# Patient Record
Sex: Male | Born: 1990 | Race: White | Hispanic: No | Marital: Married | Smoking: Never smoker
Health system: Southern US, Community
[De-identification: ages and names within clinical notes are randomized; demographics above are authoritative.]

## PROBLEM LIST (undated history)

## (undated) DIAGNOSIS — R002 Palpitations: Secondary | ICD-10-CM

## (undated) HISTORY — PX: NO PAST SURGERIES: SHX2092

## (undated) HISTORY — DX: Palpitations: R00.2

---
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
## 2014-07-14 ENCOUNTER — Emergency Department: Payer: Self-pay | Admitting: Emergency Medicine
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
## 2020-02-17 ENCOUNTER — Ambulatory Visit: Payer: Self-pay | Attending: Internal Medicine

## 2020-02-17 DIAGNOSIS — Z23 Encounter for immunization: Secondary | ICD-10-CM

## 2020-02-17 NOTE — Progress Notes (Signed)
   Covid-19 Vaccination Clinic  Name:  Greg Chen    MRN: [id number] DOB: 28-May-1991  02/17/2020  Mr. Greg Chen was observed post Covid-19 immunization for 15 minutes without incident. He was provided with Vaccine Information Sheet and instruction to access the V-Safe system.   Mr. Greg Chen was instructed to call 911 with any severe reactions post vaccine: Marland Kitchen Difficulty breathing  . Swelling of face and throat  . A fast heartbeat  . A bad rash all over body  . Dizziness and weakness   Immunizations Administered    Name Date Dose VIS Date Route   Pfizer COVID-19 Vaccine 02/17/2020  4:40 PM 0.3 mL 11/03/2019 Intramuscular   Manufacturer: [employer], [employer]   Lot: [id number]   NDC: [address]-2

## 2020-03-12 ENCOUNTER — Ambulatory Visit: Payer: Self-pay | Attending: Internal Medicine

## 2020-03-12 DIAGNOSIS — Z23 Encounter for immunization: Secondary | ICD-10-CM

## 2020-03-12 NOTE — Progress Notes (Signed)
   Covid-19 Vaccination Clinic  Name:  Greg Chen    MRN: [id number] DOB: 1991/11/13  03/12/2020  [name] was observed post Covid-19 immunization for 15 minutes without incident. He was provided with Vaccine Information Sheet and instruction to access the V-Safe system.   [name] was instructed to call 911 with any severe reactions post vaccine: Marland Kitchen Difficulty breathing  . Swelling of face and throat  . A fast heartbeat  . A bad rash all over body  . Dizziness and weakness   Immunizations Administered    Name Date Dose VIS Date Route   Pfizer COVID-19 Vaccine 03/12/2020  3:52 PM 0.3 mL 01/17/2019 Intramuscular   Manufacturer: [employer], [employer]   Lot: [id number]   NDC: [address]-3

## 2022-11-18 ENCOUNTER — Other Ambulatory Visit (HOSPITAL_COMMUNITY): Payer: Self-pay

## 2022-11-18 MED ORDER — LISDEXAMFETAMINE DIMESYLATE 30 MG PO CAPS
30.0000 mg | ORAL_CAPSULE | Freq: Every morning | ORAL | 0 refills | Status: AC
  Filled 2022-11-18: qty 30, 30d supply, fill #0

## 2022-11-20 ENCOUNTER — Other Ambulatory Visit (HOSPITAL_COMMUNITY): Payer: Self-pay

## 2022-12-29 ENCOUNTER — Other Ambulatory Visit (HOSPITAL_COMMUNITY): Payer: Self-pay

## 2023-01-14 ENCOUNTER — Other Ambulatory Visit (HOSPITAL_COMMUNITY): Payer: Self-pay

## 2023-01-14 MED ORDER — LISDEXAMFETAMINE DIMESYLATE 30 MG PO CAPS
30.0000 mg | ORAL_CAPSULE | Freq: Every morning | ORAL | 0 refills | Status: AC
  Filled 2023-02-16: qty 30, 30d supply, fill #0

## 2023-01-14 MED ORDER — LISDEXAMFETAMINE DIMESYLATE 30 MG PO CAPS
30.0000 mg | ORAL_CAPSULE | Freq: Every morning | ORAL | 0 refills | Status: DC
  Filled 2023-03-27: qty 30, 30d supply, fill #0

## 2023-01-14 MED ORDER — LISDEXAMFETAMINE DIMESYLATE 30 MG PO CAPS
30.0000 mg | ORAL_CAPSULE | Freq: Every morning | ORAL | 0 refills | Status: AC
  Filled 2023-01-14: qty 30, 30d supply, fill #0

## 2023-02-17 ENCOUNTER — Other Ambulatory Visit (HOSPITAL_COMMUNITY): Payer: Self-pay

## 2023-02-24 ENCOUNTER — Other Ambulatory Visit (HOSPITAL_COMMUNITY): Payer: Self-pay

## 2023-03-29 ENCOUNTER — Other Ambulatory Visit (HOSPITAL_COMMUNITY): Payer: Self-pay

## 2023-04-21 ENCOUNTER — Other Ambulatory Visit (HOSPITAL_COMMUNITY): Payer: Self-pay

## 2023-04-21 MED ORDER — LISDEXAMFETAMINE DIMESYLATE 30 MG PO CAPS
30.0000 mg | ORAL_CAPSULE | Freq: Every morning | ORAL | 0 refills | Status: AC
  Filled 2023-04-29: qty 30, 30d supply, fill #0
  Filled 2023-04-30: qty 10, 10d supply, fill #0
  Filled 2023-04-30: qty 20, 20d supply, fill #0
  Filled 2023-04-30: qty 20, 20d supply, fill #1
  Filled 2023-04-30: qty 20, 20d supply, fill #0
  Filled 2023-04-30: qty 10, 10d supply, fill #0

## 2023-04-21 MED ORDER — LISDEXAMFETAMINE DIMESYLATE 30 MG PO CAPS
30.0000 mg | ORAL_CAPSULE | Freq: Every morning | ORAL | 0 refills | Status: AC
  Filled 2023-08-10: qty 30, 30d supply, fill #0

## 2023-04-21 MED ORDER — LISDEXAMFETAMINE DIMESYLATE 30 MG PO CAPS
30.0000 mg | ORAL_CAPSULE | Freq: Every morning | ORAL | 0 refills | Status: AC
  Filled 2023-06-04: qty 30, 30d supply, fill #0

## 2023-04-29 ENCOUNTER — Other Ambulatory Visit: Payer: Self-pay

## 2023-04-29 ENCOUNTER — Other Ambulatory Visit (HOSPITAL_COMMUNITY): Payer: Self-pay

## 2023-04-30 ENCOUNTER — Other Ambulatory Visit: Payer: Self-pay

## 2023-04-30 ENCOUNTER — Other Ambulatory Visit (HOSPITAL_COMMUNITY): Payer: Self-pay

## 2023-05-01 ENCOUNTER — Other Ambulatory Visit (HOSPITAL_COMMUNITY): Payer: Self-pay

## 2023-05-05 ENCOUNTER — Other Ambulatory Visit (HOSPITAL_COMMUNITY): Payer: Self-pay

## 2023-06-04 ENCOUNTER — Other Ambulatory Visit: Payer: Self-pay

## 2023-06-04 ENCOUNTER — Other Ambulatory Visit (HOSPITAL_COMMUNITY): Payer: Self-pay

## 2023-06-07 ENCOUNTER — Other Ambulatory Visit (HOSPITAL_COMMUNITY): Payer: Self-pay

## 2023-07-05 ENCOUNTER — Other Ambulatory Visit: Payer: Self-pay

## 2023-07-05 ENCOUNTER — Other Ambulatory Visit (HOSPITAL_COMMUNITY): Payer: Self-pay

## 2023-07-05 MED ORDER — LISDEXAMFETAMINE DIMESYLATE 30 MG PO CAPS: 30.0000 mg | ORAL_CAPSULE | Freq: Every morning | ORAL | 0 refills | Status: AC

## 2023-07-05 MED ORDER — LISDEXAMFETAMINE DIMESYLATE 30 MG PO CAPS
30.0000 mg | ORAL_CAPSULE | Freq: Every morning | ORAL | 0 refills | Status: AC
  Filled 2023-07-05: qty 30, 30d supply, fill #0

## 2023-07-05 MED ORDER — LISDEXAMFETAMINE DIMESYLATE 30 MG PO CAPS
30.0000 mg | ORAL_CAPSULE | Freq: Every morning | ORAL | 0 refills | Status: AC
  Filled 2023-09-16: qty 30, 30d supply, fill #0

## 2023-07-05 MED ORDER — LISDEXAMFETAMINE DIMESYLATE 30 MG PO CAPS
30.0000 mg | ORAL_CAPSULE | Freq: Every morning | ORAL | 0 refills | Status: AC
  Filled 2023-11-18: qty 30, 30d supply, fill #0

## 2023-08-10 ENCOUNTER — Other Ambulatory Visit (HOSPITAL_COMMUNITY): Payer: Self-pay

## 2023-09-16 ENCOUNTER — Other Ambulatory Visit: Payer: Self-pay

## 2023-09-16 ENCOUNTER — Other Ambulatory Visit (HOSPITAL_COMMUNITY): Payer: Self-pay

## 2023-09-27 ENCOUNTER — Other Ambulatory Visit (HOSPITAL_COMMUNITY): Payer: Self-pay

## 2023-09-27 MED ORDER — LISDEXAMFETAMINE DIMESYLATE 30 MG PO CAPS
30.0000 mg | ORAL_CAPSULE | Freq: Every morning | ORAL | 0 refills | Status: AC
  Filled 2023-10-17: qty 30, 30d supply, fill #0

## 2023-09-27 MED ORDER — LISDEXAMFETAMINE DIMESYLATE 30 MG PO CAPS: 30.0000 mg | ORAL_CAPSULE | Freq: Every morning | ORAL | 0 refills | Status: AC

## 2023-10-18 ENCOUNTER — Other Ambulatory Visit (HOSPITAL_COMMUNITY): Payer: Self-pay

## 2023-10-19 ENCOUNTER — Other Ambulatory Visit (HOSPITAL_COMMUNITY): Payer: Self-pay

## 2023-11-18 ENCOUNTER — Other Ambulatory Visit (HOSPITAL_COMMUNITY): Payer: Self-pay

## 2023-11-19 ENCOUNTER — Other Ambulatory Visit (HOSPITAL_COMMUNITY): Payer: Self-pay

## 2023-12-20 ENCOUNTER — Other Ambulatory Visit (HOSPITAL_COMMUNITY): Payer: Self-pay

## 2023-12-20 MED ORDER — LISDEXAMFETAMINE DIMESYLATE 20 MG PO CAPS
20.0000 mg | ORAL_CAPSULE | Freq: Every day | ORAL | 0 refills | Status: DC
  Filled 2023-12-20: qty 30, 30d supply, fill #0

## 2024-01-17 ENCOUNTER — Other Ambulatory Visit (HOSPITAL_COMMUNITY): Payer: Self-pay

## 2024-01-17 MED ORDER — LISDEXAMFETAMINE DIMESYLATE 20 MG PO CAPS
20.0000 mg | ORAL_CAPSULE | Freq: Every day | ORAL | 0 refills | Status: AC
  Filled 2024-02-24: qty 30, 30d supply, fill #0

## 2024-01-17 MED ORDER — LISDEXAMFETAMINE DIMESYLATE 20 MG PO CAPS
20.0000 mg | ORAL_CAPSULE | Freq: Every day | ORAL | 0 refills | Status: AC
  Filled 2024-01-20: qty 30, 30d supply, fill #0

## 2024-01-20 ENCOUNTER — Other Ambulatory Visit (HOSPITAL_COMMUNITY): Payer: Self-pay

## 2024-01-24 ENCOUNTER — Other Ambulatory Visit (HOSPITAL_COMMUNITY): Payer: Self-pay

## 2024-01-24 MED ORDER — LISDEXAMFETAMINE DIMESYLATE 20 MG PO CAPS
20.0000 mg | ORAL_CAPSULE | Freq: Every day | ORAL | 0 refills | Status: AC
  Filled 2024-06-16: qty 30, 30d supply, fill #0

## 2024-02-24 ENCOUNTER — Other Ambulatory Visit: Payer: Self-pay

## 2024-02-24 ENCOUNTER — Other Ambulatory Visit (HOSPITAL_COMMUNITY): Payer: Self-pay

## 2024-03-09 ENCOUNTER — Other Ambulatory Visit: Payer: Self-pay

## 2024-03-09 ENCOUNTER — Emergency Department (HOSPITAL_COMMUNITY)

## 2024-03-09 ENCOUNTER — Emergency Department (HOSPITAL_COMMUNITY)
Admission: EM | Admit: 2024-03-09 | Discharge: 2024-03-10 | Disposition: A | Discharge status: Home / Self Care | Attending: Emergency Medicine | Admitting: Emergency Medicine

## 2024-03-09 DIAGNOSIS — R002 Palpitations: Secondary | ICD-10-CM | POA: Insufficient documentation

## 2024-03-09 LAB — CBC
HCT: 45.2 % (ref 39.0–52.0)
Hemoglobin: 15.3 g/dL (ref 13.0–17.0)
MCH: 28.7 pg (ref 26.0–34.0)
MCHC: 33.8 g/dL (ref 30.0–36.0)
MCV: 84.8 fL (ref 80.0–100.0)
Platelets: 270 10*3/uL (ref 150–400)
RBC: 5.33 MIL/uL (ref 4.22–5.81)
RDW: 12.4 % (ref 11.5–15.5)
WBC: 7.4 10*3/uL (ref 4.0–10.5)
nRBC: 0 % (ref 0.0–0.2)

## 2024-03-09 LAB — BASIC METABOLIC PANEL WITH GFR
Anion gap: 10 (ref 5–15)
BUN: 14 mg/dL (ref 6–20)
CO2: 26 mmol/L (ref 22–32)
Calcium: 9.7 mg/dL (ref 8.9–10.3)
Chloride: 104 mmol/L (ref 98–111)
Creatinine, Ser: 0.86 mg/dL (ref 0.61–1.24)
GFR, Estimated: >60 mL/min (ref >60)
Glucose, Bld: 121 mg/dL — ABNORMAL HIGH (ref 70–99)
Potassium: 3.7 mmol/L (ref 3.5–5.1)
Sodium: 140 mmol/L (ref 135–145)

## 2024-03-09 LAB — TROPONIN I (HIGH SENSITIVITY): Troponin I (High Sensitivity): 3 ng/L (ref <18)
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
## 2024-03-10 LAB — TROPONIN I (HIGH SENSITIVITY): Troponin I (High Sensitivity): 3 ng/L (ref <18)

## 2024-03-10 NOTE — ED Notes (Signed)
 AVS provided by edp was reviewed with pt. Pt verbalized understanding with no additional questions at this time.

## 2024-03-10 NOTE — ED Provider Notes (Signed)
 [place] EMERGENCY DEPARTMENT AT [facility] Provider Note   CSN: [id number] Arrival date & time: 03/09/24  2152     History  Chief Complaint  Patient presents with   Palpitations    Greg Chen is a 33 y.o. male.  Patient presents the emergency room complaining of palpitations after eating a AAA.  Patient states he had similar episodes after taking antihistamines back in 2019.  He was evaluated by cardiology at the time and states he had a mostly unremarkable workup.  Patient states that this evening he was eating [employer] when he felt his heart racing and his Apple Watch reported heart rates of approximately 140 which lasted for upwards of an hour to 2 hours.  Patient reported some chest pressure.  Patient did stop taking Vyvanse  on Tuesday of this week.  He denies shortness of breath, abdominal pain, nausea, vomiting, other symptoms at this time.   Palpitations      Home Medications Prior to Admission medications   Medication Sig Start Date End Date Taking? Authorizing Provider  lisdexamfetamine (VYVANSE ) 20 MG capsule Take 1 capsule (20 mg total) by mouth daily. 01/17/24     lisdexamfetamine (VYVANSE ) 20 MG capsule Take 1 capsule (20 mg total) by mouth daily. 02/14/24     lisdexamfetamine (VYVANSE ) 20 MG capsule Take 1 capsule (20 mg total) by mouth daily. 03/16/24     lisdexamfetamine (VYVANSE ) 30 MG capsule Take 1 capsule (30 mg total) by mouth every morning. 11/18/22     lisdexamfetamine (VYVANSE ) 30 MG capsule Take 1 capsule (30 mg total) by mouth in the morning. 02/12/23     lisdexamfetamine (VYVANSE ) 30 MG capsule Take 1 capsule (30 mg total) by mouth in the morning. 01/14/23     lisdexamfetamine (VYVANSE ) 30 MG capsule Take 1 capsule (30 mg total) by mouth in the morning. 06/21/23 06/21/23     lisdexamfetamine (VYVANSE ) 30 MG capsule Take 1 capsule (30 mg total) by mouth in the morning. 05/22/23 05/22/23     lisdexamfetamine (VYVANSE ) 30 MG capsule Take 1 capsule (30  mg total) by mouth in the morning. 04/21/23     lisdexamfetamine (VYVANSE ) 30 MG capsule Take 1 capsule (30 mg total) by mouth in the morning. 07/05/23     lisdexamfetamine (VYVANSE ) 30 MG capsule Take 1 capsule (30 mg total) by mouth in the morning. 09/04/23     lisdexamfetamine (VYVANSE ) 30 MG capsule Take 1 capsule (30 mg total) by mouth in the morning. 08/05/23     lisdexamfetamine (VYVANSE ) 30 MG capsule Take 1 capsule (30 mg total) by mouth in the morning. 08/05/23     lisdexamfetamine (VYVANSE ) 30 MG capsule Take 1 capsule (30 mg total) by mouth in the morning. 09/27/23     lisdexamfetamine (VYVANSE ) 30 MG capsule Take 1 capsule (30 mg total) by mouth in the morning. 11/27/23     lisdexamfetamine (VYVANSE ) 30 MG capsule Take 1 capsule (30 mg total) by mouth in the morning. 10/27/23         Allergies    Antihistamines, diphenhydramine-type; Cetirizine & related; and Tripelennamine    Review of Systems   Review of Systems  Cardiovascular:  Positive for palpitations.    Physical Exam Updated Vital Signs BP 135/75 (BP Location: Left Arm)   Pulse 76   Temp 98.8 F (37.1 C)   Resp 17   Ht 5\' 6"  (1.676 m)   Wt 77.1 kg   SpO2 99%   BMI 27.44 kg/m  Physical  Exam Vitals and nursing note reviewed.  Constitutional:      General: He is not in acute distress.    Appearance: He is well-developed.  HENT:     Head: Normocephalic and atraumatic.  Eyes:     Conjunctiva/sclera: Conjunctivae normal.  Cardiovascular:     Rate and Rhythm: Normal rate and regular rhythm.     Heart sounds: No murmur heard. Pulmonary:     Effort: Pulmonary effort is normal. No respiratory distress.     Breath sounds: Normal breath sounds.  Musculoskeletal:        General: No swelling.     Cervical back: Neck supple.  Skin:    General: Skin is warm and dry.     Capillary Refill: Capillary refill takes less than 2 seconds.  Neurological:     Mental Status: He is alert.  Psychiatric:        Mood and Affect:  Mood normal.     ED Results / Procedures / Treatments   Labs (all labs ordered are listed, but only abnormal results are displayed) Labs Reviewed  BASIC METABOLIC PANEL WITH GFR - Abnormal; Notable for the following components:      Result Value   Glucose, Bld 121 (*)    All other components within normal limits  CBC  TROPONIN I (HIGH SENSITIVITY)  TROPONIN I (HIGH SENSITIVITY)    EKG None  Radiology DG Chest 2 View Result Date: 03/09/2024 CLINICAL DATA:  Chest pain EXAM: CHEST - 2 VIEW COMPARISON:  10/27/2006 FINDINGS: The heart size and mediastinal contours are within normal limits. Both lungs are clear. The visualized skeletal structures are unremarkable. IMPRESSION: No active cardiopulmonary disease. Electronically Signed   By: Violeta Grey M.D.   On: 03/09/2024 22:32    Procedures Procedures    Medications Ordered in ED Medications - No data to display  ED Course/ Medical Decision Making/ A&P                                 Medical Decision Making Amount and/or Complexity of Data Reviewed Labs: ordered. Radiology: ordered.   This patient presents to the ED for concern of palpitations, this involves an extensive number of treatment options, and is a complaint that carries with it a high risk of complications and morbidity.  The differential diagnosis includes asthma, ACS, anxiety, others   Co morbidities that complicate the patient evaluation  None   Additional history obtained:  Additional history obtained from visitor at bedside External records from outside source obtained and reviewed including cardiology notes from 2019   Lab Tests:  I Ordered, and personally interpreted labs.  The pertinent results include: Negative troponins x 2   Imaging Studies ordered:  I ordered imaging studies including chest x-ray I independently visualized and interpreted imaging which showed no active disease I agree with the radiologist interpretation   Cardiac  Monitoring: / EKG:  The patient was maintained on a cardiac monitor.  I personally viewed and interpreted the cardiac monitored which showed an underlying rhythm of: Sinus arrhythmia with occasional PVCs   Test / Admission - Considered:  Patient with low heart score, negative troponins x 2.  EKG showing occasional PVCs.  Symptoms have subsided at this time and I do not believe the patient needs admission or further emergent workup.  No pneumonia on chest x-ray.  No clinical signs of pulmonary embolism.  Patient stable to discharge home at  this time with outpatient follow-up with cardiology and primary care.  Will provide ambulatory referral to cardiology.  Return precautions provided including repeat episodes of tachycardia or shortness of breath.         Final Clinical Impression(s) / ED Diagnoses Final diagnoses:  Palpitations    Rx / DC Orders ED Discharge Orders          Ordered    Ambulatory referral to Cardiology       Comments: If you have not heard from the Cardiology office within the next 72 hours please call [phone].   03/10/24 0152              Rollins, [place]son, PA-C 03/10/24 0152    Alissa April, MD 03/10/24 [phone]

## 2024-03-10 NOTE — Discharge Instructions (Signed)
 Your workup tonight was reassuring.  I have placed a referral to cardiology.  They should contact you to schedule an appointment for further evaluation.  You may also follow-up with your primary care provider.  If you develop further episodes of rapid heart rate or other life-threatening symptoms please return to the emergency department for further evaluation.

## 2024-03-24 DIAGNOSIS — R002 Palpitations: Secondary | ICD-10-CM | POA: Insufficient documentation

## 2024-03-27 ENCOUNTER — Ambulatory Visit

## 2024-03-27 VITALS — BP 146/84 | HR 81 | Ht 66.0 in | Wt 177.0 lb

## 2024-03-27 DIAGNOSIS — R002 Palpitations: Secondary | ICD-10-CM

## 2024-03-27 DIAGNOSIS — R030 Elevated blood-pressure reading, without diagnosis of hypertension: Secondary | ICD-10-CM | POA: Insufficient documentation

## 2024-03-27 DIAGNOSIS — E785 Hyperlipidemia, unspecified: Secondary | ICD-10-CM | POA: Insufficient documentation

## 2024-03-27 NOTE — Assessment & Plan Note (Signed)
 Lipid panel from 08-09-2023 cholesterol 151 HDL 61, LDL 78 [in contrast panel numbers from March 09, 2022 total cholesterol 277, LDL 198, HDL 70]. Has been on rosuvastatin 10 mg once daily tolerating well with good improvement as above. Continue with the same and follow-up with PCP

## 2024-03-27 NOTE — Assessment & Plan Note (Addendum)
 Symptoms with elevated heart rate, correlating with sinus tachycardia and elevated blood pressures during ER visit, correlated with his ongoing use of Vyvanse  [which had been on for 8 years without symptoms] and more recent use of Pepcid for about 3 to 4 weeks preceding the event.  Symptoms have improved since he self discontinued Pepcid and Vyvanse  for the last couple weeks. Occasional isolated ventricular ectopy noted on EKG at ER visit.  Has excellent functional capacity. Advised him to continue monitoring for symptoms. Will assess with Zio patch for any significant abnormal heart rhythms and assess ectopy burden.  If no significant abnormalities on Zio patch, will defer to PCP to further evaluate for need for Vyvanse  and restart as needed.  Will defer to PCP to assess for any thyroid abnormalities if he continues to be symptomatic.Aaron Aas

## 2024-03-27 NOTE — Patient Instructions (Signed)
 Medication Instructions:  Your physician recommends that you continue on your current medications as directed. Please refer to the Current Medication list given to you today.  *If you need a refill on your cardiac medications before your next appointment, please call your pharmacy*   Lab Work: None ordered If you have labs (blood work) drawn today and your tests are completely normal, you will receive your results only by: MyChart Message (if you have MyChart) OR A paper copy in the mail If you have any lab test that is abnormal or we need to change your treatment, we will call you to review the results.   Testing/Procedures: A zio monitor was ordered today. It will remain on for 7 days. Remove 04/03/2024. You will then return monitor and event diary in provided box. It takes 1-2 weeks for report to be downloaded and returned to us . We will call you with the results. If monitor falls off or has orange flashing light, please call Zio for further instructions.    Follow-Up: At [facility], you and your health needs are our priority.  As part of our continuing mission to provide you with exceptional heart care, we have created designated Provider Care Teams.  These Care Teams include your primary Cardiologist (physician) and Advanced Practice Providers (APPs -  Physician Assistants and Nurse Practitioners) who all work together to provide you with the care you need, when you need it.  We recommend signing up for the patient portal called "MyChart".  Sign up information is provided on this After Visit Summary.  MyChart is used to connect with patients for Virtual Visits (Telemedicine).  Patients are able to view lab/test results, encounter notes, upcoming appointments, etc.  Non-urgent messages can be sent to your provider as well.   To learn more about what you can do with MyChart, go to [email or web address].    Your next appointment:   1 month(s)  The format for your next  appointment:   In Person  Provider:   Hillis Lu, MD    Other Instructions none  Important Information About Sugar

## 2024-03-27 NOTE — Progress Notes (Signed)
 Cardiology Consultation:    Date:  03/27/2024   ID:  Greg Chen, DOB 12/13/1990, MRN [id number]  PCP:  Claudell Cruz, MD  Cardiologist:  Daymon Evans Amya Hlad, MD   Referring MD: Claudell Cruz, MD   No chief complaint on file.    ASSESSMENT AND PLAN:   [name] 33 year old male here for further evaluation of palpitations.  Does have a history of ADD and RSR prime pattern on EKG done previously and normal cardiac structure and function on echocardiogram from December 2019 at [facility].  Problem List Items Addressed This Visit     Palpitations - Primary   Symptoms with elevated heart rate, correlating with sinus tachycardia and elevated blood pressures during ER visit, correlated with his ongoing use of Vyvanse  [which had been on for 8 years without symptoms] and more recent use of Pepcid for about 3 to 4 weeks preceding the event.  Symptoms have improved since he self discontinued Pepcid and Vyvanse  for the last couple weeks. Occasional isolated ventricular ectopy noted on EKG at ER visit.  Has excellent functional capacity. Advised him to continue monitoring for symptoms. Will assess with Zio patch for any significant abnormal heart rhythms and assess ectopy burden.  If no significant abnormalities on Zio patch, will defer to PCP to further evaluate for need for Vyvanse  and restart as needed.  Will defer to PCP to assess for any thyroid abnormalities if he continues to be symptomatic.Aaron Aas      Relevant Orders   EKG 12-Lead (Completed)   LONG TERM MONITOR (3-14 DAYS)   Elevated blood pressure reading in office without diagnosis of hypertension   Elevated blood pressures here in the office but well-controlled at home. Advised him to continue monitoring at home and if blood pressures consistently above 130/80 mmHg will need management for hypertension.       Dyslipidemia   Lipid panel from 08-09-2023 cholesterol 151 HDL 61, LDL 78 [in contrast panel numbers from March 09, 2022 total cholesterol 277, LDL 198, HDL 70]. Has been on rosuvastatin 10 mg once daily tolerating well with good improvement as above. Continue with the same and follow-up with PCP      Relevant Medications   rosuvastatin (CRESTOR) 10 MG tablet   Return to clinic tentatively in 1 month.   History of Present Illness:    Greg Chen is a 33 y.o. male who is being seen today for the evaluation of palpitations at the request of Claudell Cruz, MD.  Previously had visit with cardiologist in November 2019 for EKG with normal variant RSR prime pattern on EKG, evaluation with echocardiogram at the time showed no significant abnormalities.  Here for the visit accompanied by his wife.  Works as a Leisure centre manager.  Has longstanding history of ADD and has been on Vyvanse  for at least 4 years, dyslipidemia and has been on rosuvastatin for multiple months with improvement in lipid panel.. Had remote history of palpitations in 2019 at the time associated with use of Zyrtec and symptoms resolved after he stopped using Zyrtec.  Was recently at the emergency room 03/09/2024 at [facility] for symptoms of palpitations which were similar to his previous episodes back in 2019.  Reports of for a few weeks prior to that he had been using Pepcid on a regular basis.  His current symptoms occurred acutely, while at rest. his heart rate on Apple Watch was elevated at 140 bpm and remained so for about 2 hours [  although review today on his smart watch, there were 2 EGM's from April 17th  around 6 PM and around 9 PM both showed sinus rhythm with mild sinus tachycardia) and associated with chest discomfort.  Workup in the ER was with high-sensitivity troponins x 2 which was unremarkable, EKG with sinus rhythm RSR prime pattern with incomplete RBBB morphology and occasional isolated PVCs.  Since then he has self discontinued using Pepcid and also discontinued use of Vyvanse . He also quit drinking coffee.  Over the last  2 weeks his symptoms have subsided and denies any significant episodes of fast heartbeat or palpitations.  He mentions blood pressures at home well-controlled, home log of blood pressures on his phone images show readings systolic in 110s. Blood pressure reading in the office today suboptimal.  Mentions excellent functional status at baseline, regularly exercises rounds for 30 to 45 minutes. Even at work has under desk walking pad.  Does not smoke. Rare occasional, social alcohol consumption. No illicit drug use.  No significant cardiac health issues that he knows of other than his mother who was estranged at a young age and apparently was dealing with substance abuse disorders and passed away related to unknown heart condition.  EKG in the clinic today shows sinus rhythm heart rate 81/min, PR interval 134 ms, QRS duration 104 ms incomplete right bundle branch block morphology, QTc 413 ms.  Echocardiogram from December 2019 at [facility] reported normal biventricular function with LVEF 55% and no significant valve abnormalities.  Lipid panel from 08-09-2023 cholesterol 151 HDL 61, LDL 78 [in contrast panel numbers from March 09, 2022 total cholesterol 277, LDL 198, HDL 70].  Prior thyroid panel to review is from April 2023 normal TSH 2.53.  Past Medical History:  Diagnosis Date   Palpitations     Past Surgical History:  Procedure Laterality Date   NO PAST SURGERIES      Current Medications: Current Meds  Medication Sig   lisdexamfetamine (VYVANSE ) 20 MG capsule Take 1 capsule (20 mg total) by mouth daily.   lisdexamfetamine (VYVANSE ) 20 MG capsule Take 1 capsule (20 mg total) by mouth daily.   lisdexamfetamine (VYVANSE ) 20 MG capsule Take 1 capsule (20 mg total) by mouth daily.   lisdexamfetamine (VYVANSE ) 30 MG capsule Take 1 capsule (30 mg total) by mouth every morning.   lisdexamfetamine (VYVANSE ) 30 MG capsule Take 1 capsule (30 mg total) by mouth in the morning.    lisdexamfetamine (VYVANSE ) 30 MG capsule Take 1 capsule (30 mg total) by mouth in the morning.   lisdexamfetamine (VYVANSE ) 30 MG capsule Take 1 capsule (30 mg total) by mouth in the morning. 06/21/23   lisdexamfetamine (VYVANSE ) 30 MG capsule Take 1 capsule (30 mg total) by mouth in the morning. 05/22/23   lisdexamfetamine (VYVANSE ) 30 MG capsule Take 1 capsule (30 mg total) by mouth in the morning.   lisdexamfetamine (VYVANSE ) 30 MG capsule Take 1 capsule (30 mg total) by mouth in the morning.   lisdexamfetamine (VYVANSE ) 30 MG capsule Take 1 capsule (30 mg total) by mouth in the morning.   lisdexamfetamine (VYVANSE ) 30 MG capsule Take 1 capsule (30 mg total) by mouth in the morning.   lisdexamfetamine (VYVANSE ) 30 MG capsule Take 1 capsule (30 mg total) by mouth in the morning.   lisdexamfetamine (VYVANSE ) 30 MG capsule Take 1 capsule (30 mg total) by mouth in the morning.   lisdexamfetamine (VYVANSE ) 30 MG capsule Take 1 capsule (30 mg total) by mouth in the morning.  lisdexamfetamine (VYVANSE ) 30 MG capsule Take 1 capsule (30 mg total) by mouth in the morning.   rosuvastatin (CRESTOR) 10 MG tablet Take 10 mg by mouth at bedtime.     Allergies:   Antihistamines, diphenhydramine-type; Cetirizine & related; and Tripelennamine   Social History   Socioeconomic History   Marital status: Married    Spouse name: Not on file   Number of children: Not on file   Years of education: Not on file   Highest education level: Not on file  Occupational History   Not on file  Tobacco Use   Smoking status: Never   Smokeless tobacco: Never  Substance and Sexual Activity   Alcohol use: Not on file   Drug use: Not on file   Sexual activity: Not on file  Other Topics Concern   Not on file  Social History Narrative   Not on file   Social Drivers of Health   Financial Resource Strain: Low Risk  (08/08/2023)   Received from [facility]   Overall Financial Resource Strain (CARDIA)    Difficulty  of Paying Living Expenses: Not hard at all  Food Insecurity: No Food Insecurity (08/08/2023)   Received from [facility]   Hunger Vital Sign    Worried About Running Out of Food in the Last Year: Never true    Ran Out of Food in the Last Year: Never true  Transportation Needs: No Transportation Needs (08/08/2023)   Received from [facility] - Transportation    Lack of Transportation (Medical): No    Lack of Transportation (Non-Medical): No  Physical Activity: Insufficiently Active (08/08/2023)   Received from [facility]   Exercise Vital Sign    Days of Exercise per Week: 3 days    Minutes of Exercise per Session: 40 min  Stress: No Stress Concern Present (08/08/2023)   Received from [facility] of Occupational Health - Occupational Stress Questionnaire    Feeling of Stress : Not at all  Social Connections: Moderately Integrated (08/08/2023)   Received from [facility]   Social Network    How would you rate your social network (family, work, friends)?: Adequate participation with social networks     Family History: The patient's family history is negative for Hypertension, Diabetes, Heart disease, and Cancer. ROS:   Please see the history of present illness.    All 14 point review of systems negative except as described per history of present illness.  EKGs/Labs/Other Studies Reviewed:    The following studies were reviewed today:   EKG:  EKG Interpretation Date/Time:  Monday Mar 27 2024 10:42:24 EDT Ventricular Rate:  81 PR Interval:  134 QRS Duration:  104 QT Interval:  356 QTC Calculation: 413 R Axis:   113  Text Interpretation: Normal sinus rhythm with sinus arrhythmia Incomplete right bundle branch block Possible Right ventricular hypertrophy Abnormal ECG When compared with ECG of 09-Mar-2024 22:06, Premature ventricular complexes are no longer Present QT has shortened Confirmed by Bertha Broad reddy [phone]) on 03/27/2024  10:55:08 AM    Recent Labs: 03/09/2024: BUN 14; Creatinine, Ser 0.86; Hemoglobin 15.3; Platelets 270; Potassium 3.7; Sodium 140  Recent Lipid Panel No results found for: "CHOL", "TRIG", "HDL", "CHOLHDL", "VLDL", "LDLCALC", "LDLDIRECT"  Physical Exam:    VS:  BP (!) 146/84   Pulse 81   Ht 5\' 6"  (1.676 m)   Wt 177 lb (80.3 kg)   SpO2 97%   BMI 28.57 kg/m  Wt Readings from Last 3 Encounters:  03/27/24 177 lb (80.3 kg)  03/09/24 170 lb (77.1 kg)     GENERAL:  Well nourished, well developed in no acute distress NECK: No JVD; No carotid bruits CARDIAC: RRR, S1 and S2 present, no murmurs, no rubs, no gallops CHEST:  Clear to auscultation without rales, wheezing or rhonchi  Extremities: No pitting pedal edema. Pulses bilaterally symmetric with radial 2+ and dorsalis pedis 2+ NEUROLOGIC:  Alert and oriented x 3  Medication Adjustments/Labs and Tests Ordered: Current medicines are reviewed at length with the patient today.  Concerns regarding medicines are outlined above.  Orders Placed This Encounter  Procedures   LONG TERM MONITOR (3-14 DAYS)   EKG 12-Lead   No orders of the defined types were placed in this encounter.   Signed, Lura Sallies, MD, MPH, [facility]. 03/27/2024 11:53 AM    [facility]

## 2024-03-27 NOTE — Assessment & Plan Note (Signed)
 Elevated blood pressures here in the office but well-controlled at home. Advised him to continue monitoring at home and if blood pressures consistently above 130/80 mmHg will need management for hypertension.

## 2024-04-03 ENCOUNTER — Other Ambulatory Visit: Payer: Self-pay

## 2024-04-03 ENCOUNTER — Other Ambulatory Visit (HOSPITAL_COMMUNITY): Payer: Self-pay

## 2024-04-03 MED ORDER — LISDEXAMFETAMINE DIMESYLATE 10 MG PO CAPS
10.0000 mg | ORAL_CAPSULE | Freq: Every day | ORAL | 0 refills | Status: AC
  Filled 2024-05-08: qty 30, 30d supply, fill #0

## 2024-04-03 MED ORDER — LISDEXAMFETAMINE DIMESYLATE 10 MG PO CAPS
10.0000 mg | ORAL_CAPSULE | Freq: Every day | ORAL | 0 refills | Status: AC
  Filled 2024-04-03: qty 30, 30d supply, fill #0

## 2024-04-28 ENCOUNTER — Ambulatory Visit

## 2024-04-28 VITALS — BP 134/78 | HR 78 | Ht 66.0 in | Wt 172.4 lb

## 2024-04-28 DIAGNOSIS — R002 Palpitations: Secondary | ICD-10-CM

## 2024-04-28 DIAGNOSIS — R030 Elevated blood-pressure reading, without diagnosis of hypertension: Secondary | ICD-10-CM | POA: Diagnosis not present

## 2024-04-28 NOTE — Assessment & Plan Note (Signed)
 Zio patch results are reassuring for 7 days without any significant abnormalities.  Slowest heart rate 42 bpm sinus bradycardia during sleeping hours.  Fastest heart rate 161 bpm sinus tachycardia. Average heart rate 66/min.  No symptoms.  Overall this is reassuring. Continue with his regular activities and medications as prescribed. Okay to resume Vyvanse  as needed from cardiac standpoint and advised to monitor for any symptoms of palpitations, high blood pressure once he resumes Vyvanse .

## 2024-04-28 NOTE — Assessment & Plan Note (Signed)
 Home blood pressures remain well-controlled. Advised him to continue to monitor. Notify us  if blood pressures are above 130/80 mmHg at home.  Advised to have his home device cross checked with equipment at PCPs office at his next follow-up visit.

## 2024-04-28 NOTE — Progress Notes (Signed)
 Cardiology Consultation:    Date:  04/28/2024   ID:  Greg Chen, DOB Jun 23, 1991, MRN [id number]  PCP:  Claudell Cruz, MD  Cardiologist:  Daymon Evans Kaylem Gidney, MD   Referring MD: Claudell Cruz, MD   Chief Complaint  Patient presents with   Results     ASSESSMENT AND PLAN:   Mr. Greg Chen 33 year old male patient with history of  ADD [on treatment with Vyvanse  for past 4 years], dyslipidemia, EKG with RSR prime pattern in lead V1, normal cardiac structure and function on prior echocardiogram from December 2019 at [facility] was evaluated for palpitations and elevated blood pressures without prior diagnosis of hypertension. Here for follow-up Problem List Items Addressed This Visit     Palpitations - Primary   Zio patch results are reassuring for 7 days without any significant abnormalities.  Slowest heart rate 42 bpm sinus bradycardia during sleeping hours.  Fastest heart rate 161 bpm sinus tachycardia. Average heart rate 66/min.  No symptoms.  Overall this is reassuring. Continue with his regular activities and medications as prescribed. Okay to resume Vyvanse  as needed from cardiac standpoint and advised to monitor for any symptoms of palpitations, high blood pressure once he resumes Vyvanse .       Elevated blood pressure reading in office without diagnosis of hypertension   Home blood pressures remain well-controlled. Advised him to continue to monitor. Notify us  if blood pressures are above 130/80 mmHg at home.  Advised to have his home device cross checked with equipment at PCPs office at his next follow-up visit.       See us  on an as-needed basis for follow-up.  History of Present Illness:    Greg Chen is a 33 y.o. male who is being seen today for follow-up. PCP is Claudell Cruz, MD. Last visit with us  in the office was 03-27-2024.  Has history of ADD [on treatment with Vyvanse  for past 4 years], dyslipidemia, EKG with RSR prime pattern in lead V1, normal  cardiac structure and function on prior echocardiogram from December 2019 at [facility] was evaluated for palpitations and elevated blood pressures without prior diagnosis of hypertension.  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  Here for the visit today accompanied by his wife.  7-day heart monitor from 03/27/2024 noted predominantly sinus rhythm with average heart rate 66/min [ranging from 42 to 161 bpm]. Rare ventricular and supraventricular ectopy burden less than 1%.  No patient triggered events or diary entries.  No significant conduction abnormalities, pauses or blocks noted.  Mentions has been doing well over the past month with no symptoms. Has not felt any symptoms while he had the heart monitor on.  Mentions blood pressures at home have been well-controlled, reviewed his home log of readings.  Past Medical History:  Diagnosis Date   Palpitations     Past Surgical History:  Procedure Laterality Date   NO PAST SURGERIES      Current Medications: Current Meds  Medication Sig   [START ON 05/04/2024] lisdexamfetamine (VYVANSE ) 10 MG capsule Take 1 capsule (10 mg total) by mouth daily.   lisdexamfetamine (VYVANSE ) 10 MG capsule Take 1 capsule (10 mg total) by mouth daily.   lisdexamfetamine (VYVANSE ) 20 MG capsule Take 1 capsule (20 mg total) by mouth daily.   lisdexamfetamine (VYVANSE ) 20 MG capsule Take 1 capsule (20 mg total) by mouth daily.   lisdexamfetamine (VYVANSE ) 20 MG capsule Take 1 capsule (20 mg total) by mouth daily.  lisdexamfetamine (VYVANSE ) 30 MG capsule Take 1 capsule (30 mg total) by mouth every morning.   lisdexamfetamine (VYVANSE ) 30 MG capsule Take 1 capsule (30 mg total) by mouth in the morning.   lisdexamfetamine (VYVANSE ) 30 MG capsule Take 1 capsule (30 mg total) by mouth in the morning.   lisdexamfetamine (VYVANSE ) 30 MG capsule Take 1 capsule (30 mg total) by mouth in the morning. 06/21/23   lisdexamfetamine (VYVANSE ) 30 MG capsule Take 1  capsule (30 mg total) by mouth in the morning. 05/22/23   lisdexamfetamine (VYVANSE ) 30 MG capsule Take 1 capsule (30 mg total) by mouth in the morning.   lisdexamfetamine (VYVANSE ) 30 MG capsule Take 1 capsule (30 mg total) by mouth in the morning.   lisdexamfetamine (VYVANSE ) 30 MG capsule Take 1 capsule (30 mg total) by mouth in the morning.   lisdexamfetamine (VYVANSE ) 30 MG capsule Take 1 capsule (30 mg total) by mouth in the morning.   lisdexamfetamine (VYVANSE ) 30 MG capsule Take 1 capsule (30 mg total) by mouth in the morning.   lisdexamfetamine (VYVANSE ) 30 MG capsule Take 1 capsule (30 mg total) by mouth in the morning.   lisdexamfetamine (VYVANSE ) 30 MG capsule Take 1 capsule (30 mg total) by mouth in the morning.   lisdexamfetamine (VYVANSE ) 30 MG capsule Take 1 capsule (30 mg total) by mouth in the morning.   rosuvastatin (CRESTOR) 10 MG tablet Take 10 mg by mouth at bedtime.     Allergies:   Antihistamines, diphenhydramine-type; Cetirizine & related; and Tripelennamine   Social History   Socioeconomic History   Marital status: Married    Spouse name: Not on file   Number of children: Not on file   Years of education: Not on file   Highest education level: Not on file  Occupational History   Not on file  Tobacco Use   Smoking status: Never   Smokeless tobacco: Never  Substance and Sexual Activity   Alcohol use: Not on file   Drug use: Not on file   Sexual activity: Not on file  Other Topics Concern   Not on file  Social History Narrative   Not on file   Social Drivers of Health   Financial Resource Strain: Low Risk  (08/08/2023)   Received from [facility]   Overall Financial Resource Strain (CARDIA)    Difficulty of Paying Living Expenses: Not hard at all  Food Insecurity: No Food Insecurity (08/08/2023)   Received from [facility]   Hunger Vital Sign    Worried About Running Out of Food in the Last Year: Never true    Ran Out of Food in the Last Year:  Never true  Transportation Needs: No Transportation Needs (08/08/2023)   Received from [facility] - Transportation    Lack of Transportation (Medical): No    Lack of Transportation (Non-Medical): No  Physical Activity: Insufficiently Active (08/08/2023)   Received from [facility]   Exercise Vital Sign    Days of Exercise per Week: 3 days    Minutes of Exercise per Session: 40 min  Stress: No Stress Concern Present (08/08/2023)   Received from [facility] of Occupational Health - Occupational Stress Questionnaire    Feeling of Stress : Not at all  Social Connections: Moderately Integrated (08/08/2023)   Received from [facility]   Social Network    How would you rate your social network (family, work, friends)?: Adequate participation with social networks  Family History: The patient's family history is negative for Hypertension, Diabetes, Heart disease, and Cancer. ROS:   Please see the history of present illness.    All 14 point review of systems negative except as described per history of present illness.  EKGs/Labs/Other Studies Reviewed:    The following studies were reviewed today:   EKG:       Recent Labs: 03/09/2024: BUN 14; Creatinine, Ser 0.86; Hemoglobin 15.3; Platelets 270; Potassium 3.7; Sodium 140  Recent Lipid Panel No results found for: "CHOL", "TRIG", "HDL", "CHOLHDL", "VLDL", "LDLCALC", "LDLDIRECT"  Physical Exam:    VS:  BP 134/78 (BP Location: Right Arm, Patient Position: Sitting)   Pulse 78   Ht 5\' 6"  (1.676 m)   Wt 172 lb 6.4 oz (78.2 kg)   SpO2 99%   BMI 27.83 kg/m     Wt Readings from Last 3 Encounters:  04/28/24 172 lb 6.4 oz (78.2 kg)  03/27/24 177 lb (80.3 kg)  03/09/24 170 lb (77.1 kg)     GENERAL:  Well nourished, well developed in no acute distress NECK: No JVD; No carotid bruits CARDIAC: RRR, S1 and S2 present, no murmurs, no rubs, no gallops CHEST:  Clear to auscultation without rales,  wheezing or rhonchi  Extremities: No pitting pedal edema. Pulses bilaterally symmetric with radial 2+ and dorsalis pedis 2+ NEUROLOGIC:  Alert and oriented x 3  Medication Adjustments/Labs and Tests Ordered: Current medicines are reviewed at length with the patient today.  Concerns regarding medicines are outlined above.  No orders of the defined types were placed in this encounter.  No orders of the defined types were placed in this encounter.   Signed, Lura Sallies, MD, MPH, [facility]. 04/28/2024 10:58 AM    [facility]

## 2024-04-28 NOTE — Patient Instructions (Signed)
 Medication Instructions:  Your physician recommends that you continue on your current medications as directed. Please refer to the Current Medication list given to you today.  *If you need a refill on your cardiac medications before your next appointment, please call your pharmacy*  Lab Work: None If you have labs (blood work) drawn today and your tests are completely normal, you will receive your results only by: MyChart Message (if you have MyChart) OR A paper copy in the mail If you have any lab test that is abnormal or we need to change your treatment, we will call you to review the results.  Testing/Procedures: None  Follow-Up: At [facility], you and your health needs are our priority.  As part of our continuing mission to provide you with exceptional heart care, our providers are all part of one team.  This team includes your primary Cardiologist (physician) and Advanced Practice Providers or APPs (Physician Assistants and Nurse Practitioners) who all work together to provide you with the care you need, when you need it.  Your next appointment:   Follow up as needed  Provider:   Huntley Dec, MD    We recommend signing up for the patient portal called "MyChart".  Sign up information is provided on this After Visit Summary.  MyChart is used to connect with patients for Virtual Visits (Telemedicine).  Patients are able to view lab/test results, encounter notes, upcoming appointments, etc.  Non-urgent messages can be sent to your provider as well.   To learn more about what you can do with MyChart, go to [email or web address].   Other Instructions None

## 2024-05-08 ENCOUNTER — Other Ambulatory Visit (HOSPITAL_COMMUNITY): Payer: Self-pay

## 2024-06-16 ENCOUNTER — Other Ambulatory Visit (HOSPITAL_COMMUNITY): Payer: Self-pay

## 2024-06-22 ENCOUNTER — Other Ambulatory Visit (HOSPITAL_COMMUNITY): Payer: Self-pay

## 2024-06-22 ENCOUNTER — Other Ambulatory Visit: Payer: Self-pay

## 2024-06-22 MED ORDER — LISDEXAMFETAMINE DIMESYLATE 10 MG PO CAPS
10.0000 mg | ORAL_CAPSULE | Freq: Every day | ORAL | 0 refills | Status: DC
  Filled 2024-10-06: qty 30, 30d supply, fill #0

## 2024-06-22 MED ORDER — LISDEXAMFETAMINE DIMESYLATE 10 MG PO CAPS
10.0000 mg | ORAL_CAPSULE | Freq: Every day | ORAL | 0 refills | Status: AC
  Filled 2024-06-22 – 2024-07-24 (×2): qty 30, 30d supply, fill #0

## 2024-06-28 ENCOUNTER — Other Ambulatory Visit (HOSPITAL_COMMUNITY): Payer: Self-pay

## 2024-07-24 ENCOUNTER — Other Ambulatory Visit (HOSPITAL_COMMUNITY): Payer: Self-pay

## 2024-08-07 ENCOUNTER — Other Ambulatory Visit (HOSPITAL_COMMUNITY): Payer: Self-pay

## 2024-08-07 MED ORDER — OMEPRAZOLE 20 MG PO CPDR
20.0000 mg | DELAYED_RELEASE_CAPSULE | Freq: Every morning | ORAL | 0 refills | Status: AC
  Filled 2024-08-07: qty 30, 30d supply, fill #0

## 2024-08-07 MED ORDER — CHLORHEXIDINE GLUCONATE 0.12 % MT SOLN
15.0000 mL | Freq: Two times a day (BID) | OROMUCOSAL | 0 refills | Status: AC
  Filled 2024-08-07: qty 473, 16d supply, fill #0

## 2024-08-17 ENCOUNTER — Other Ambulatory Visit (HOSPITAL_COMMUNITY): Payer: Self-pay

## 2024-08-22 ENCOUNTER — Other Ambulatory Visit (HOSPITAL_COMMUNITY): Payer: Self-pay

## 2024-08-22 MED ORDER — LISDEXAMFETAMINE DIMESYLATE 10 MG PO CAPS
10.0000 mg | ORAL_CAPSULE | Freq: Every day | ORAL | 0 refills | Status: DC
  Filled 2024-08-24: qty 30, 30d supply, fill #0

## 2024-08-24 ENCOUNTER — Other Ambulatory Visit (HOSPITAL_COMMUNITY): Payer: Self-pay

## 2024-10-06 ENCOUNTER — Other Ambulatory Visit (HOSPITAL_COMMUNITY): Payer: Self-pay

## 2024-11-09 ENCOUNTER — Other Ambulatory Visit (HOSPITAL_COMMUNITY): Payer: Self-pay

## 2024-11-13 ENCOUNTER — Other Ambulatory Visit (HOSPITAL_COMMUNITY): Payer: Self-pay

## 2024-11-13 MED ORDER — LISDEXAMFETAMINE DIMESYLATE 10 MG PO CAPS
10.0000 mg | ORAL_CAPSULE | Freq: Every day | ORAL | 0 refills | Status: AC
  Filled 2024-12-15 – 2024-12-18 (×2): qty 30, 30d supply, fill #0

## 2024-11-13 MED ORDER — LISDEXAMFETAMINE DIMESYLATE 10 MG PO CAPS: 10.0000 mg | ORAL_CAPSULE | Freq: Every day | ORAL | 0 refills | Status: AC

## 2024-11-13 MED ORDER — LISDEXAMFETAMINE DIMESYLATE 10 MG PO CAPS
10.0000 mg | ORAL_CAPSULE | Freq: Every day | ORAL | 0 refills | Status: AC
  Filled 2024-11-13: qty 30, 30d supply, fill #0

## 2024-12-15 ENCOUNTER — Other Ambulatory Visit (HOSPITAL_COMMUNITY): Payer: Self-pay

## 2024-12-18 ENCOUNTER — Other Ambulatory Visit (HOSPITAL_COMMUNITY): Payer: Self-pay
